# Patient Record
Sex: Female | Born: 2000 | Race: White | Hispanic: No | Marital: Single | Smoking: Never smoker
Health system: Southern US, Community
[De-identification: ages and names within clinical notes are randomized; demographics above are authoritative.]

## PROBLEM LIST (undated history)

## (undated) DIAGNOSIS — M9262 Juvenile osteochondrosis of tarsus, left ankle: Secondary | ICD-10-CM

## (undated) DIAGNOSIS — F909 Attention-deficit hyperactivity disorder, unspecified type: Secondary | ICD-10-CM

## (undated) DIAGNOSIS — M928 Other specified juvenile osteochondrosis: Secondary | ICD-10-CM

## (undated) DIAGNOSIS — N938 Other specified abnormal uterine and vaginal bleeding: Secondary | ICD-10-CM

## (undated) HISTORY — DX: Attention-deficit hyperactivity disorder, unspecified type: F90.9

## (undated) HISTORY — PX: MOUTH SURGERY: SHX715

## (undated) HISTORY — DX: Other specified juvenile osteochondrosis: M92.8

## (undated) HISTORY — DX: Juvenile osteochondrosis of tarsus, left ankle: M92.62

## (undated) HISTORY — DX: Other specified abnormal uterine and vaginal bleeding: N93.8

## (undated) HISTORY — PX: TYMPANOSTOMY TUBE PLACEMENT: SHX32

---
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
## 2020-11-05 DIAGNOSIS — R112 Nausea with vomiting, unspecified: Secondary | ICD-10-CM | POA: Insufficient documentation

## 2020-11-06 ENCOUNTER — Other Ambulatory Visit: Payer: Self-pay

## 2020-11-06 ENCOUNTER — Emergency Department
Admission: EM | Admit: 2020-11-06 | Discharge: 2020-11-06 | Disposition: A | Discharge status: Home / Self Care | Payer: BC Managed Care – PPO | Attending: Emergency Medicine | Admitting: Emergency Medicine

## 2020-11-06 ENCOUNTER — Encounter: Payer: Self-pay | Admitting: Emergency Medicine

## 2020-11-06 DIAGNOSIS — R112 Nausea with vomiting, unspecified: Secondary | ICD-10-CM

## 2020-11-06 LAB — CBC
HCT: 34.1 % — ABNORMAL LOW (ref 36.0–46.0)
Hemoglobin: 10.8 g/dL — ABNORMAL LOW (ref 12.0–15.0)
MCH: 22 pg — ABNORMAL LOW (ref 26.0–34.0)
MCHC: 31.7 g/dL (ref 30.0–36.0)
MCV: 69.5 fL — ABNORMAL LOW (ref 80.0–100.0)
Platelets: 239 10*3/uL (ref 150–400)
RBC: 4.91 MIL/uL (ref 3.87–5.11)
RDW: 17.7 % — ABNORMAL HIGH (ref 11.5–15.5)
WBC: 8.7 10*3/uL (ref 4.0–10.5)
nRBC: 0 % (ref 0.0–0.2)

## 2020-11-06 LAB — URINALYSIS, COMPLETE (UACMP) WITH MICROSCOPIC
Bacteria, UA: NONE SEEN
Bilirubin Urine: NEGATIVE
Glucose, UA: NEGATIVE mg/dL
Hgb urine dipstick: NEGATIVE
Ketones, ur: NEGATIVE mg/dL
Nitrite: NEGATIVE
Protein, ur: NEGATIVE mg/dL
Specific Gravity, Urine: 1.016 (ref 1.005–1.030)
pH: 6 (ref 5.0–8.0)

## 2020-11-06 LAB — LIPASE, BLOOD: Lipase: 35 U/L (ref 11–51)

## 2020-11-06 LAB — COMPREHENSIVE METABOLIC PANEL
ALT: 10 U/L (ref 0–44)
AST: 14 U/L — ABNORMAL LOW (ref 15–41)
Albumin: 4.1 g/dL (ref 3.5–5.0)
Alkaline Phosphatase: 75 U/L (ref 38–126)
Anion gap: 7 (ref 5–15)
BUN: 13 mg/dL (ref 6–20)
CO2: 24 mmol/L (ref 22–32)
Calcium: 9.6 mg/dL (ref 8.9–10.3)
Chloride: 107 mmol/L (ref 98–111)
Creatinine, Ser: 0.7 mg/dL (ref 0.44–1.00)
GFR, Estimated: >60 mL/min (ref >60)
Glucose, Bld: 91 mg/dL (ref 70–99)
Potassium: 3.9 mmol/L (ref 3.5–5.1)
Sodium: 138 mmol/L (ref 135–145)
Total Bilirubin: 0.4 mg/dL (ref 0.3–1.2)
Total Protein: 7.2 g/dL (ref 6.5–8.1)

## 2020-11-06 LAB — POC URINE PREG, ED: Preg Test, Ur: NEGATIVE

## 2020-11-06 MED ORDER — ALUM & MAG HYDROXIDE-SIMETH 200-200-20 MG/5ML PO SUSP
30.0000 mL | Freq: Once | ORAL | Status: AC
  Administered 2020-11-06: 30 mL via ORAL
  Filled 2020-11-06: qty 30

## 2020-11-06 MED ORDER — ONDANSETRON 4 MG PO TBDP
4.0000 mg | ORAL_TABLET | Freq: Once | ORAL | Status: AC | PRN
  Administered 2020-11-06: 4 mg via ORAL
  Filled 2020-11-06: qty 1

## 2020-11-06 MED ORDER — FAMOTIDINE 20 MG PO TABS
20.0000 mg | ORAL_TABLET | Freq: Once | ORAL | Status: AC
  Administered 2020-11-06: 20 mg via ORAL
  Filled 2020-11-06: qty 1

## 2020-11-06 MED ORDER — ONDANSETRON 4 MG PO TBDP: 4.0000 mg | ORAL_TABLET | Freq: Three times a day (TID) | ORAL | 0 refills | Status: DC | PRN

## 2020-11-06 MED ORDER — FAMOTIDINE 20 MG PO TABS: 20.0000 mg | ORAL_TABLET | Freq: Every day | ORAL | 1 refills | Status: DC

## 2020-11-06 NOTE — ED Provider Notes (Signed)
[facility] Emergency Department Provider Note  ____________________________________________  Time seen: Approximately 4:32 AM  I have reviewed the triage vital signs and the nursing notes.   HISTORY  Chief Complaint Vomiting   HPI Sydney Ho is a 20 y.o. female with a history of constipation and GERD who presents for evaluation of nausea and vomiting.  Patient reports having 13 episodes of nonbloody nonbilious emesis that started this afternoon.  No diarrhea, no abdominal pain.  She does feel a sensation of burning in her throat from vomiting so many times.  No hematemesis, no coffee-ground emesis.  No fever or chills.  No known sick contact exposures.  According to the mother patient does have issues with GERD and constipation on a regular basis.  She has never seen a GI doctor.  No fever or respiratory symptoms.   Past Medical History:  Diagnosis Date  . ADHD (attention deficit hyperactivity disorder)   . Apophysitis of left calcaneus   . 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 URI (upper respiratory infection) 09/30/2013  . Sore throat 09/30/2013  . Dysfunctional uterine bleeding 06/22/2013  . Apophysitis of left calcaneus   . Voiding dysfunction 07/05/2012  . Learning difficulty 07/02/2012  . Constipation 07/02/2012  . ADHD (attention deficit hyperactivity disorder) 12/01/2011    Past Surgical History:  Procedure Laterality Date  . MOUTH SURGERY    . TYMPANOSTOMY TUBE PLACEMENT      Prior to Admission medications   Medication Sig Start Date End Date Taking? Authorizing Provider  famotidine (PEPCID) 20 MG tablet Take 1 tablet (20 mg total) by mouth at bedtime. 11/06/20 11/06/21 Yes Darold Miley, [place], MD  ondansetron (ZOFRAN ODT) 4 MG disintegrating tablet Take 1 tablet (4 mg total) by mouth every 8 (eight) hours as needed. 11/06/20  Yes Brain Honeycutt, [place], MD  busPIRone (BUSPAR) 5 MG  tablet Take 5 mg by mouth daily.    [provider]  cetirizine (ZYRTEC) 10 MG tablet Take 1 tablet (10 mg total) by mouth daily. 09/30/13   Georgiann Hahn, MD  fluticasone (FLONASE) 50 MCG/ACT nasal spray Place 1 spray into both nostrils daily. 09/30/13 09/30/14  Georgiann Hahn, MD  ibuprofen (ADVIL,MOTRIN) 200 MG tablet Take 200 mg by mouth every 6 (six) hours as needed for pain.    [provider]  Methylphenidate HCl (QUILLIVANT XR PO) Take by mouth. Take 20ml by mouth daily    [provider]  polyethylene glycol powder (GLYCOLAX/MIRALAX) powder Take 17 g by mouth daily. 07/02/12   Faylene Kurtz, MD    Allergies Lactose intolerance (gi)  Family History  Problem Relation Age of Onset  . Depression Father   . Hyperlipidemia Father   . Endometriosis Mother   . Cancer Paternal Grandfather   . Heart disease Paternal Grandfather     Social History Social History   Tobacco Use  . Smoking status: Never Smoker  . Smokeless tobacco: Never Used  Substance Use Topics  . Alcohol use: No  . Drug use: No    Review of Systems  Constitutional: Negative for fever. Eyes: Negative for visual changes. ENT: Negative for sore throat. Neck: No neck pain  Cardiovascular: Negative for chest pain. Respiratory: Negative for shortness of breath. Gastrointestinal: Negative for abdominal pain, or diarrhea. + vomiting Genitourinary: Negative for dysuria. Musculoskeletal: Negative for back pain. Skin: Negative for rash. Neurological: Negative for headaches, weakness or numbness. Psych: No SI or HI  ____________________________________________   PHYSICAL EXAM:  VITAL SIGNS: Vitals:   11/06/20 0315 11/06/20 0435  BP: 101/71 102/78  Pulse: 80 88  Resp: 16 18  Temp:    SpO2: 100% 100%    Constitutional: Alert and oriented. Well appearing and in no apparent distress. HEENT:      Head: Normocephalic and atraumatic.         Eyes: Conjunctivae are normal. Sclera  is non-icteric.       Mouth/Throat: Mucous membranes are moist.       Neck: Supple with no signs of meningismus. Cardiovascular: Regular rate and rhythm. No murmurs, gallops, or rubs. 2+ symmetrical distal pulses are present in all extremities. No JVD. Respiratory: Normal respiratory effort. Lungs are clear to auscultation bilaterally.  Gastrointestinal: Soft, non tender, and non distended with positive bowel sounds. No rebound or guarding. Genitourinary: No CVA tenderness. Musculoskeletal:  No edema, cyanosis, or erythema of extremities. Neurologic: Normal speech and language. Face is symmetric. Moving all extremities. No gross focal neurologic deficits are appreciated. Skin: Skin is warm, dry and intact. No rash noted. Psychiatric: Mood and affect are normal. Speech and behavior are normal.  ____________________________________________   LABS (all labs ordered are listed, but only abnormal results are displayed)  Labs Reviewed  COMPREHENSIVE METABOLIC PANEL - Abnormal; Notable for the following components:      Result Value   AST 14 (*)    All other components within normal limits  CBC - Abnormal; Notable for the following components:   Hemoglobin 10.8 (*)    HCT 34.1 (*)    MCV 69.5 (*)    MCH 22.0 (*)    RDW 17.7 (*)    All other components within normal limits  URINALYSIS, COMPLETE (UACMP) WITH MICROSCOPIC - Abnormal; Notable for the following components:   Color, Urine YELLOW (*)    APPearance HAZY (*)    Leukocytes,Ua TRACE (*)    All other components within normal limits  LIPASE, BLOOD  POC URINE PREG, ED   ____________________________________________  EKG  none  ____________________________________________  RADIOLOGY  none  ____________________________________________   PROCEDURES  Procedure(s) performed: None Procedures Critical Care performed:  None ____________________________________________   INITIAL IMPRESSION / ASSESSMENT AND PLAN / ED  COURSE  20 y.o. female with a history of constipation and GERD who presents for evaluation of nausea and vomiting.  Patient is extremely well-appearing in no distress with normal vital signs, abdomen is soft and nontender throughout with positive bowel sounds, no distention.  No signs of dehydration.  Negative pregnancy test.  No signs of pancreatitis with normal lipase.  No signs of gallbladder disorder with no abdominal tenderness, normal LFTs and T bili.  No leukocytosis.  No signs of diabetes with normal sugar UA negative for urinary tract infection or any signs of dehydration.  Patient does have mild anemia with a low MCV consistent with iron deficiency anemia.  She has a history of such according to her mother who was at bedside.  She does not take any supplements for it.  Her anemia is from abnormal uterine bleeding.  Her presentation is concerning for GERD versus ingestion versus gastritis versus food poisoning versus viral illness.  Will give p.o. Zofran, Pepcid, Maalox and p.o. challenge.  History gathered from patient and her mother who was at bedside.  Old medical records reviewed.  Recommended follow-up with GI since patient seems to have these problems constantly.  Plan to discharge home on Pepcid.  _________________________ 5:38 AM on 11/06/2020 -----------------------------------------  Patient tolerating p.o. no further episodes of vomiting.  Stable for discharge return precautions discussed with her and her mother    _____________________________________________ Please note:  Patient was evaluated in Emergency Department today for the symptoms described in the history of present illness. Patient was evaluated in the context of the global COVID-19 pandemic, which necessitated consideration that the patient might be at risk for infection with the SARS-CoV-2 virus that causes COVID-19. Institutional protocols and algorithms that pertain to the evaluation of patients at risk for COVID-19  are in a state of rapid change based on information released by regulatory bodies including the CDC and federal and state organizations. These policies and algorithms were followed during the patient's care in the ED.  Some ED evaluations and interventions may be delayed as a result of limited staffing during the pandemic.   [place] Controlled Substance Database was reviewed by me. ____________________________________________   FINAL CLINICAL IMPRESSION(S) / ED DIAGNOSES   Final diagnoses:  Non-intractable vomiting with nausea, unspecified vomiting type      NEW MEDICATIONS STARTED DURING THIS VISIT:  ED Discharge Orders         Ordered    ondansetron (ZOFRAN ODT) 4 MG disintegrating tablet  Every 8 hours PRN        11/06/20 0435    famotidine (PEPCID) 20 MG tablet  Daily at bedtime        11/06/20 0435           Note:  This document was prepared using Dragon voice recognition software and may include unintentional dictation errors.    Don Perking, [place], MD 11/06/20 [phone]

## 2020-11-06 NOTE — Discharge Instructions (Addendum)
Take Pepcid every night as prescribed to avoid having similar symptoms.  Take Zofran as needed for nausea.  Increase oral hydration and bland diet for the next 48 hours.  You will need to see a GI doctor within the next few months.  Make sure to call the office to schedule an appointment.  Return to the emergency room for have a fever, abdominal pain, chest pain or shortness of breath.

## 2020-11-06 NOTE — ED Notes (Signed)
Pt given graham crackers and ginger ale for PO challenge at this time.

## 2020-11-06 NOTE — ED Triage Notes (Signed)
Pt to ED from home c/o vomiting today.  13 episodes of vomiting after eating several different things at home.  Denies pain, nausea now but no vomiting since being here.  A&Ox4, chest rise even and unlabored, skin WNL, in NAD at this time.

## 2021-10-12 ENCOUNTER — Other Ambulatory Visit: Payer: Self-pay | Admitting: Gastroenterology

## 2021-10-12 DIAGNOSIS — R1011 Right upper quadrant pain: Secondary | ICD-10-CM

## 2021-10-14 ENCOUNTER — Ambulatory Visit
Admission: RE | Admit: 2021-10-14 | Discharge: 2021-10-14 | Disposition: A | Discharge status: Home / Self Care | Payer: BLUE CROSS/BLUE SHIELD | Source: Ambulatory Visit | Attending: Gastroenterology | Admitting: Gastroenterology

## 2021-10-14 ENCOUNTER — Other Ambulatory Visit: Payer: Self-pay

## 2021-10-14 DIAGNOSIS — R1011 Right upper quadrant pain: Secondary | ICD-10-CM

## 2022-09-23 ENCOUNTER — Other Ambulatory Visit: Payer: Self-pay

## 2022-09-23 ENCOUNTER — Emergency Department
Admission: EM | Admit: 2022-09-23 | Discharge: 2022-09-23 | Disposition: A | Discharge status: Home / Self Care | Payer: BC Managed Care – PPO | Attending: Emergency Medicine | Admitting: Emergency Medicine

## 2022-09-23 DIAGNOSIS — S0181XA Laceration without foreign body of other part of head, initial encounter: Secondary | ICD-10-CM | POA: Diagnosis not present

## 2022-09-23 DIAGNOSIS — S0990XA Unspecified injury of head, initial encounter: Secondary | ICD-10-CM | POA: Diagnosis present

## 2022-09-23 DIAGNOSIS — W010XXA Fall on same level from slipping, tripping and stumbling without subsequent striking against object, initial encounter: Secondary | ICD-10-CM | POA: Insufficient documentation

## 2022-09-23 MED ORDER — LIDOCAINE-EPINEPHRINE-TETRACAINE (LET) TOPICAL GEL
3.0000 mL | Freq: Once | TOPICAL | Status: AC
  Administered 2022-09-23: 3 mL via TOPICAL
  Filled 2022-09-23: qty 3

## 2022-09-23 MED ORDER — CEPHALEXIN 500 MG PO CAPS: 500.0000 mg | ORAL_CAPSULE | Freq: Four times a day (QID) | ORAL | 0 refills | Status: AC

## 2022-09-23 NOTE — ED Provider Notes (Signed)
[facility] Provider Note  Patient Contact: 5:55 PM (approximate)   History   Laceration (Chin)   HPI  Sydney Ho is a 22 y.o. female presents to the emergency department with a 3 cm laceration under chin.  Patient was wearing cowboy boots that were too large for her and she reached down to grab keychain and lost her balance.  No loss of consciousness occurred.  No jaw pain or numbness or tingling in the upper and lower extremities.  No chest pain, chest tightness or abdominal pain.      Physical Exam   Triage Vital Signs: ED Triage Vitals  Enc Vitals Group     BP 09/23/22 1610 (!) 120/91     Pulse Rate 09/23/22 1610 100     Resp 09/23/22 1610 16     Temp 09/23/22 1610 (!) 97.5 F (36.4 C)     Temp Source 09/23/22 1610 Oral     SpO2 09/23/22 1610 98 %     Weight 09/23/22 1611 135 lb (61.2 kg)     Height 09/23/22 1611 '5\' 6"'$  (1.676 m)     Head Circumference --      Peak Flow --      Pain Score 09/23/22 1611 10     Pain Loc --      Pain Edu? --      Excl. in [place]? --     Most recent vital signs: Vitals:   09/23/22 1610  BP: (!) 120/91  Pulse: 100  Resp: 16  Temp: (!) 97.5 F (36.4 C)  SpO2: 98%     General: Alert and in no acute distress. Eyes:  PERRL. EOMI. Head: No acute traumatic findings ENT:      Nose: No congestion/rhinnorhea.      Mouth/Throat: Mucous membranes are moist. Neck: No stridor. No cervical spine tenderness to palpation. Cardiovascular:  Good peripheral perfusion Respiratory: Normal respiratory effort without tachypnea or retractions. Lungs CTAB. Good air entry to the bases with no decreased or absent breath sounds. Gastrointestinal: Bowel sounds 4 quadrants. Soft and nontender to palpation. No guarding or rigidity. No palpable masses. No distention. No CVA tenderness. Musculoskeletal: Full range of motion to all extremities.  Neurologic:  No gross focal neurologic deficits are appreciated.  Skin: Patient has  a 3 cm laceration chin.    ED Results / Procedures / Treatments   Labs (all labs ordered are listed, but only abnormal results are displayed) Labs Reviewed - No data to display       PROCEDURES:  Critical Care performed: No  ..Laceration Repair  Date/Time: 09/23/2022 5:57 PM  Performed by: Lannie Fields, PA-C Authorized by: Lannie Fields, PA-C   Consent:    Consent obtained:  Verbal   Risks discussed:  Infection and pain   Alternatives discussed:  No treatment Universal protocol:    Procedure explained and questions answered to patient or proxy's satisfaction: yes     Patient identity confirmed:  Verbally with patient Anesthesia:    Anesthesia method:  Topical application   Topical anesthetic:  LET Laceration details:    Location:  Face   Face location:  Chin   Length (cm):  3   Depth (mm):  5 Pre-procedure details:    Preparation:  Patient was prepped and draped in usual sterile fashion Exploration:    Limited defect created (wound extended): no     Contaminated: no   Treatment:    Area cleansed with:  Povidone-iodine  Amount of cleaning:  Standard   Debridement:  None Skin repair:    Repair method:  Sutures   Suture size:  5-0   Suture technique:  Simple interrupted   Number of sutures:  4 Approximation:    Approximation:  Close Repair type:    Repair type:  Simple Post-procedure details:    Dressing:  Open (no dressing)    MEDICATIONS ORDERED IN ED: Medications  lidocaine-EPINEPHrine-tetracaine (LET) topical gel (has no administration in time range)     IMPRESSION / MDM / ASSESSMENT AND PLAN / ED COURSE  I reviewed the triage vital signs and the nursing notes.                              Assessment and plan: Facial laceration:  22 year old female presents to the emergency department with a chin laceration after mechanical fall.  Vital signs are reassuring at triage.  On exam, patient alert, active and nontoxic-appearing with no  neurodeficits.  Patient underwent laceration repair without complication.  Patient education regarding wound care was given.  Return precautions were given to return with new or worsening symptoms.  Tetanus status is up-to-date.  All patient questions were answered.     FINAL CLINICAL IMPRESSION(S) / ED DIAGNOSES   Final diagnoses:  Chin laceration, initial encounter     Rx / DC Orders   ED Discharge Orders          Ordered    cephALEXin (KEFLEX) 500 MG capsule  4 times daily        09/23/22 1856             Note:  This document was prepared using Dragon voice recognition software and may include unintentional dictation errors.   Vallarie Mare [place], PA-C 09/23/22 1857    Nathaniel Man, MD 09/23/22 2012

## 2022-09-23 NOTE — ED Triage Notes (Signed)
Pt to ED from home for laceration. Pt tripped and fell and landed on her chin. Pt denies any pain any other places. Pt is CAOx4 and in no acute distress in triage. Ambulatory in triage. Pt has small laceration to chin with bleeding controlled.

## 2022-09-23 NOTE — Discharge Instructions (Addendum)
Take Keflex four times daily for the next seven days.  Apply thin layer of Vaseline once daily. Have sutures removed in 7 days.

## 2022-11-21 ENCOUNTER — Ambulatory Visit: Payer: BC Managed Care – PPO | Attending: Cardiovascular Disease | Admitting: Cardiovascular Disease

## 2022-11-21 ENCOUNTER — Encounter: Payer: Self-pay | Admitting: Cardiovascular Disease

## 2022-11-21 ENCOUNTER — Ambulatory Visit (INDEPENDENT_AMBULATORY_CARE_PROVIDER_SITE_OTHER): Payer: BC Managed Care – PPO

## 2022-11-21 VITALS — BP 100/72 | HR 82 | Ht 66.0 in | Wt 163.1 lb

## 2022-11-21 DIAGNOSIS — R0602 Shortness of breath: Secondary | ICD-10-CM

## 2022-11-21 DIAGNOSIS — R002 Palpitations: Secondary | ICD-10-CM

## 2022-11-21 DIAGNOSIS — R079 Chest pain, unspecified: Secondary | ICD-10-CM

## 2022-11-21 NOTE — Patient Instructions (Signed)
Medication Instructions:  No changes *If you need a refill on your cardiac medications before your next appointment, please call your pharmacy*   Lab Work: None ordered If you have labs (blood work) drawn today and your tests are completely normal, you will receive your results only by: MyChart Message (if you have MyChart) OR A paper copy in the mail If you have any lab test that is abnormal or we need to change your treatment, we will call you to review the results.   Testing/Procedures: Your physician has requested that you have an echocardiogram. Echocardiography is a painless test that uses sound waves to create images of your heart. It provides your doctor with information about the size and shape of your heart and how well your heart's chambers and valves are working.   You may receive an ultrasound enhancing agent through an IV if needed to better visualize your heart during the echo. This procedure takes approximately one hour.  There are no restrictions for this procedure.  This will take place at [address] [address], [place] [address]    Follow-Up: At [facility], you and your health needs are our priority.  As part of our continuing mission to provide you with exceptional heart care, we have created designated Provider Care Teams.  These Care Teams include your primary Cardiologist (physician) and Advanced Practice Providers (APPs -  Physician Assistants and Nurse Practitioners) who all work together to provide you with the care you need, when you need it.  We recommend signing up for the patient portal called "MyChart".  Sign up information is provided on this After Visit Summary.  MyChart is used to connect with patients for Virtual Visits (Telemedicine).  Patients are able to view lab/test results, encounter notes, upcoming appointments, etc.  Non-urgent messages can be sent to your provider as well.   To learn more about what you can  do with MyChart, go to [email or web address].    Your next appointment:   Follow up as needed with Dr. Kirke Corin Other Instructions ZIO XT- Long Term Monitor Instructions  Your physician has requested you wear a ZIO patch monitor for 14 days.  This is a single patch monitor. [employer] supplies one patch monitor per enrollment. Additional stickers are not available. Please do not apply patch if you will be having a Nuclear Stress Test,  Echocardiogram, Cardiac CT, MRI, or Chest Xray during the period you would be wearing the  monitor. The patch cannot be worn during these tests. You cannot remove and re-apply the  ZIO XT patch monitor.  Your ZIO patch monitor will be mailed 3 day USPS to your address on file. It may take 3-5 days  to receive your monitor after you have been enrolled.  Once you have received your monitor, please review the enclosed instructions. Your monitor  has already been registered assigning a specific monitor serial # to you.  Billing and Patient Assistance Program Information  We have supplied [employer] with any of your insurance information on file for billing purposes. [employer] offers a sliding scale Patient Assistance Program for patients that do not have  insurance, or whose insurance does not completely cover the cost of the ZIO monitor.  You must apply for the Patient Assistance Program to qualify for this discounted rate.  To apply, please call [employer] at [phone], select option 4, select option 2, ask to apply for  Patient Assistance Program. Meredeth Ide will ask your household income, and how many  people  are in your household. They will quote your out-of-pocket cost based on that information.  [employer] will also be able to set up a 32-month, interest-free payment plan if needed.  Applying the monitor   Shave hair from upper left chest.  Hold abrader disc by orange tab. Rub abrader in 40 strokes over the upper left chest as  indicated in your monitor  instructions.  Clean area with 4 enclosed alcohol pads. Let dry.  Apply patch as indicated in monitor instructions. Patch will be placed under collarbone on left  side of chest with arrow pointing upward.  Rub patch adhesive wings for 2 minutes. Remove white label marked "1". Remove the white  label marked "2". Rub patch adhesive wings for 2 additional minutes.  While looking in a mirror, press and release button in center of patch. A small green light will  flash 3-4 times. This will be your only indicator that the monitor has been turned on.  Do not shower for the first 24 hours. You may shower after the first 24 hours.  Press the button if you feel a symptom. You will hear a small click. Record Date, Time and  Symptom in the Patient Logbook.  When you are ready to remove the patch, follow instructions on the last 2 pages of Patient  Logbook. Stick patch monitor onto the last page of Patient Logbook.  Place Patient Logbook in the blue and white box. Use locking tab on box and tape box closed  securely. The blue and white box has prepaid postage on it. Please place it in the mailbox as  soon as possible. Your physician should have your test results approximately 7 days after the  monitor has been mailed back to [facility].  Call [facility] Customer Care at [phone] if you have questions regarding  your ZIO XT patch monitor. Call them immediately if you see an orange light blinking on your  monitor.  If your monitor falls off in less than 4 days, contact our Monitor department at [phone].  If your monitor becomes loose or falls off after 4 days call [employer] at [phone] for  suggestions on securing your monitor

## 2022-11-21 NOTE — Progress Notes (Signed)
Cardiology Office Note   Date:  11/21/2022   ID:  [name], DOB [birth year], MRN [id number]  PCP:  Lonie Peak, PA-C  Cardiologist:   Lorine Bears, MD   Chief Complaint  Patient presents with   New Patient (Initial Visit)    Palpitations. Meds reviewed verbally with pt.      History of Present Illness: Sydney Ho is a 22 y.o. female who was referred by Lonie Peak for evaluation of palpitations.  Her mother is with her at the visit.  She reports history of palpitations and tachycardia as a child.  She reports having an echocardiogram at the age of 62 or 22 years old which was unremarkable. Over the last year, she experienced intermittent episodes of tachycardia with associated chest pain and shortness of breath.  She feels like she is having a panic attack but denies any stress or anxiety.  She gets very dizzy with these episodes but no syncope.  She is a lifelong non-smoker.  She does drink excessive amount of sweet tea and occasional coffee and is trying to quit on caffeine intake. She has known history of ADHD but is no longer on medications which made her feel bad.    Past Medical History:  Diagnosis Date   ADHD (attention deficit hyperactivity disorder)    Apophysitis of left calcaneus    Dysfunctional uterine bleeding 11.2014    Past Surgical History:  Procedure Laterality Date   MOUTH SURGERY     TYMPANOSTOMY TUBE PLACEMENT       Current Outpatient Medications  Medication Sig Dispense Refill   COLLAGEN PO Take by mouth daily.     ibuprofen (ADVIL,MOTRIN) 200 MG tablet Take 200 mg by mouth every 6 (six) hours as needed for pain.     No current facility-administered medications for this visit.    Allergies:   Buspar [buspirone] and Lactose intolerance (gi)    Social History:  The patient  reports that she has never smoked. She has never used smokeless tobacco. She reports that she does not drink alcohol and does not use drugs.   Family  History:  The patient's family history includes Atrial fibrillation in her father, paternal grandmother, and paternal uncle; Cancer in her paternal grandfather; Depression in her father; Endometriosis in her mother; Heart disease in her paternal grandfather; Hyperlipidemia in her father and mother.    ROS:  Please see the history of present illness.   Otherwise, review of systems are positive for none.   All other systems are reviewed and negative.    PHYSICAL EXAM: VS:  BP 100/72 (BP Location: Right Arm, Patient Position: Sitting, Cuff Size: Normal)   Pulse 82   Ht  (1.676 m)   Wt 163 lb 2 oz (74 kg)   SpO2 99%   BMI 26.33 kg/m  , BMI Body mass index is 26.33 kg/m. GEN: Well nourished, well developed, in no acute distress  HEENT: normal  Neck: no JVD, carotid bruits, or masses Cardiac: RRR; no murmurs, rubs, or gallops,no edema  Respiratory:  clear to auscultation bilaterally, normal work of breathing GI: soft, nontender, nondistended, + BS MS: no deformity or atrophy  Skin: warm and dry, no rash Neuro:  Strength and sensation are intact Psych: euthymic mood, full affect   EKG:  EKG is ordered today. The ekg ordered today demonstrates normal sinus rhythm with sinus arrhythmia.   Recent Labs: No results found for requested labs within last 365 days.  Lipid Panel No results found for: "CHOL", "TRIG", "HDL", "CHOLHDL", "VLDL", "LDLCALC", "LDLDIRECT"    Wt Readings from Last 3 Encounters:  11/21/22 163 lb 2 oz (74 kg)  09/23/22 135 lb (61.2 kg)  11/06/20 104 lb (47.2 kg) (7 %, Z= -1.48)*   * Growth percentiles are based on CDC (Girls, 2-20 Years) data.          11/21/2022    2:05 PM  PAD Screen  Previous PAD dx? No  Previous surgical procedure? No  Pain with walking? No  Feet/toe relief with dangling? No  Painful, non-healing ulcers? No  Extremities discolored? No      ASSESSMENT AND PLAN:  1.  Palpitations and tachycardia: We have to exclude SVT.   Baseline EKG is unremarkable.  I requested a 2-week ZIO monitor.  I did discuss with her the utility of smart phone or smart watch monitors if she continues to have these episodes with nondiagnostic ZIO monitor.  She reports having labs done recently with her primary care physician.  I recommend checking her thyroid function if that has not been already done. I discussed with her the importance of cutting down on caffeine intake and regular exercise.  2.  Chest pain and shortness of breath, in the setting of tachycardia.  I requested an echocardiogram to ensure no structural heart abnormalities.    Disposition:   FU with me as needed.  Signed,  Lorine Bears, MD  11/21/2022 2:22 PM    [facility]

## 2022-11-27 DIAGNOSIS — R002 Palpitations: Secondary | ICD-10-CM | POA: Diagnosis not present

## 2022-12-20 ENCOUNTER — Other Ambulatory Visit: Payer: BC Managed Care – PPO

## 2022-12-27 ENCOUNTER — Other Ambulatory Visit: Payer: BC Managed Care – PPO

## 2022-12-27 ENCOUNTER — Telehealth: Payer: Self-pay | Admitting: Cardiovascular Disease

## 2022-12-27 NOTE — Telephone Encounter (Signed)
  Pt is calling to get heart monitor result  

## 2022-12-27 NOTE — Telephone Encounter (Signed)
The patient has been notified of the result along with recommendations.Pt verbalized understanding. All questions (if any) were answered        Sydney Ouch, MD 12/22/2022  6:07 PM EDT     Inform patient that monitor was normal with no evidence of arrhythmia.

## 2023-01-09 ENCOUNTER — Ambulatory Visit: Payer: BC Managed Care – PPO | Attending: Cardiovascular Disease

## 2023-01-09 DIAGNOSIS — R0602 Shortness of breath: Secondary | ICD-10-CM | POA: Diagnosis not present

## 2023-01-09 DIAGNOSIS — R079 Chest pain, unspecified: Secondary | ICD-10-CM

## 2023-01-09 LAB — ECHOCARDIOGRAM COMPLETE
Area-P 1/2: 3.85 cm2
S' Lateral: 3.1 cm

## 2023-01-28 ENCOUNTER — Emergency Department: Payer: BC Managed Care – PPO

## 2023-01-28 ENCOUNTER — Emergency Department
Admission: EM | Admit: 2023-01-28 | Discharge: 2023-01-28 | Disposition: A | Discharge status: Home / Self Care | Payer: BC Managed Care – PPO | Attending: Emergency Medicine | Admitting: Emergency Medicine

## 2023-01-28 ENCOUNTER — Other Ambulatory Visit: Payer: Self-pay

## 2023-01-28 DIAGNOSIS — R079 Chest pain, unspecified: Secondary | ICD-10-CM | POA: Diagnosis present

## 2023-01-28 LAB — CBC
HCT: 38.2 % (ref 36.0–46.0)
Hemoglobin: 11.7 g/dL — ABNORMAL LOW (ref 12.0–15.0)
MCH: 22.9 pg — ABNORMAL LOW (ref 26.0–34.0)
MCHC: 30.6 g/dL (ref 30.0–36.0)
MCV: 74.9 fL — ABNORMAL LOW (ref 80.0–100.0)
Platelets: 232 10*3/uL (ref 150–400)
RBC: 5.1 MIL/uL (ref 3.87–5.11)
RDW: 14.6 % (ref 11.5–15.5)
WBC: 6.3 10*3/uL (ref 4.0–10.5)
nRBC: 0 % (ref 0.0–0.2)

## 2023-01-28 LAB — BASIC METABOLIC PANEL
Anion gap: 7 (ref 5–15)
BUN: 11 mg/dL (ref 6–20)
CO2: 24 mmol/L (ref 22–32)
Calcium: 9.2 mg/dL (ref 8.9–10.3)
Chloride: 106 mmol/L (ref 98–111)
Creatinine, Ser: 0.85 mg/dL (ref 0.44–1.00)
GFR, Estimated: >60 mL/min (ref >60)
Glucose, Bld: 89 mg/dL (ref 70–99)
Potassium: 3.9 mmol/L (ref 3.5–5.1)
Sodium: 137 mmol/L (ref 135–145)

## 2023-01-28 LAB — TROPONIN I (HIGH SENSITIVITY): Troponin I (High Sensitivity): <2 ng/L (ref <18)

## 2023-01-28 LAB — MAGNESIUM: Magnesium: 2.1 mg/dL (ref 1.7–2.4)

## 2023-01-28 LAB — TSH: TSH: 2.442 u[IU]/mL (ref 0.350–4.500)

## 2023-01-28 NOTE — Discharge Instructions (Signed)
You were seen in the Emergency Department today for evaluation of your chest pain. Fortunately, your labs, EKG, and chest x-Sydney Ho were overall reassuring against a emergency cause for your pain. Please follow-up with your primary doctor within the next few days for reevaluation.Return to the ER for any new or worsening symptoms including worsening chest pain, difficulty breathing, or any other new or concerning symptoms that you believe warrants immediate attention.   

## 2023-01-28 NOTE — ED Triage Notes (Signed)
Pt states coming in with chest pressure and stuttering, which has since resolved. Pt states this has been a recurring thing for over a year and that her providers have told her she has anxiety, but pt denies feeling anxious. Pt denies blurred vision or dizziness as well.

## 2023-01-28 NOTE — ED Notes (Signed)
Pt A&O x4, no obvious distress noted, respirations regular/unlabored. Pt verbalizes understanding of discharge instructions. Pt able to ambulate from ED independently.   

## 2023-01-28 NOTE — ED Provider Notes (Signed)
[facility] Provider Note    Event Date/Time   First MD Initiated Contact with Patient 01/28/23 1232     (approximate)   History   Chest Pain   HPI  Sydney Ho is a 22 y.o. female presenting to the emergency department for evaluation of chest pain and palpitations.  For the last year, patient has been having ongoing issues related to this.  Today, she had a 10-minute episode of chest pressure with associated palpitations and some stuttering speech.  She has seen a primary care doctor and cardiologist for her symptoms.  She had a normal heart monitor, though she does not report any episodes while wearing this, and a reassuring echocardiogram.  She called her primary care doctor today who recommended ER presentation for further evaluation.  Patient currently reports her chest pain has resolved.  I did review her outpatient cardiology note during which she was ordered for heart monitor and echocardiogram.  At that time, thyroid studies were recommended, do not see result for these in her chart.     Physical Exam   Triage Vital Signs: ED Triage Vitals  Enc Vitals Group     BP 01/28/23 1215 106/75     Pulse Rate 01/28/23 1215 90     Resp 01/28/23 1215 15     Temp 01/28/23 1215 97.8 F (36.6 C)     Temp Source 01/28/23 1215 Oral     SpO2 01/28/23 1215 98 %     Weight 01/28/23 1216 163 lb 2.3 oz (74 kg)     Height 01/28/23 1216 5\' 6"  (1.676 m)     Head Circumference --      Peak Flow --      Pain Score 01/28/23 1216 0     Pain Loc --      Pain Edu? --      Excl. in GC? --     Most recent vital signs: Vitals:   01/28/23 1215  BP: 106/75  Pulse: 90  Resp: 15  Temp: 97.8 F (36.6 C)  SpO2: 98%     General: Awake, interactive  CV:  Regular rate, good peripheral perfusion.  Resp:  Lungs clear, unlabored respirations.  Abd:  Soft, nondistended.  Neuro:  Symmetric facial movement, fluid speech   ED Results / Procedures / Treatments    Labs (all labs ordered are listed, but only abnormal results are displayed) Labs Reviewed  CBC - Abnormal; Notable for the following components:      Result Value   Hemoglobin 11.7 (*)    MCV 74.9 (*)    MCH 22.9 (*)    All other components within normal limits  BASIC METABOLIC PANEL  TSH  MAGNESIUM  TROPONIN I (HIGH SENSITIVITY)     EKG EKG independently reviewed interpreted by myself (ER attending) demonstrates:  EKG demonstrates normal sinus rhythm -83, PR 122, QRS 78, QTc 439, no acute ST changes  RADIOLOGY Imaging independently reviewed and interpreted by myself demonstrates:  CXR without pneumonia  PROCEDURES:  Critical Care performed: No  Procedures   MEDICATIONS ORDERED IN ED: Medications - No data to display   IMPRESSION / MDM / ASSESSMENT AND PLAN / ED COURSE  I reviewed the triage vital signs and the nursing notes.  Differential diagnosis includes, but is not limited to, anemia, electrolyte abnormality, thyroid dysfunction, low suspicion ACS, consideration for pneumonia, pneumothorax  Patient's presentation is most consistent with acute presentation with potential threat to life or bodily function.  22 year old female presenting with chest pain and palpitations.  Ongoing symptoms for almost a year with multiple outpatient evaluations.  Will obtain screening lab work including thyroid function.  Currently asymptomatic.  Lab work overall reassuring.  Mild anemia, actually improved from prior.  Electrolytes within normal limits.  TSH within normal limits.  Patient reevaluated.  No new symptoms.  Discussed reassuring workup and recommended continued outpatient follow-up.  Patient expressed understanding.  She is comfortable discharge home.  Strict return precautions provided.     FINAL CLINICAL IMPRESSION(S) / ED DIAGNOSES   Final diagnoses:  Nonspecific chest pain     Rx / DC Orders   ED Discharge Orders     None        Note:  This document  was prepared using Dragon voice recognition software and may include unintentional dictation errors.   Trinna Post, MD 01/28/23 [phone]

## 2023-09-30 IMAGING — US US ABDOMEN LIMITED
1 series · 14 of 25 positions shown · non-contrast
Comparison: None.

CLINICAL DATA: Right upper quadrant abdominal pain.

EXAM:
ULTRASOUND ABDOMEN LIMITED RIGHT UPPER QUADRANT

[Series 1: us abdomen limited · 0.17mm/px · 14 of 48 slices shown]
[im 1/48]
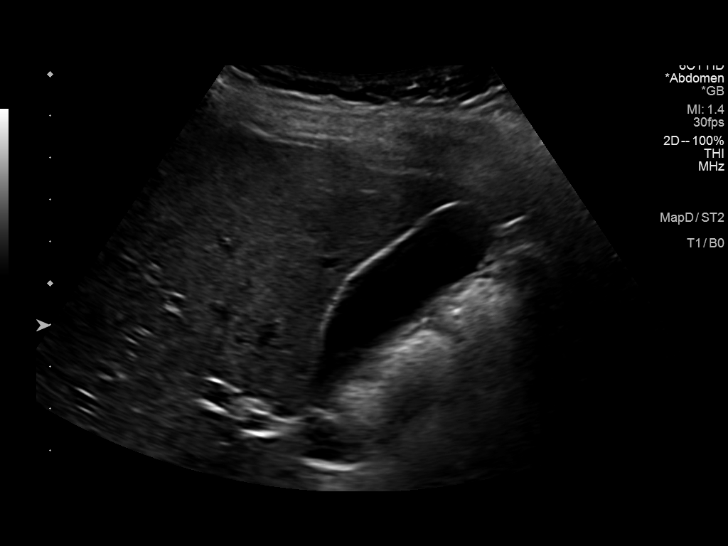
[im 4/48]
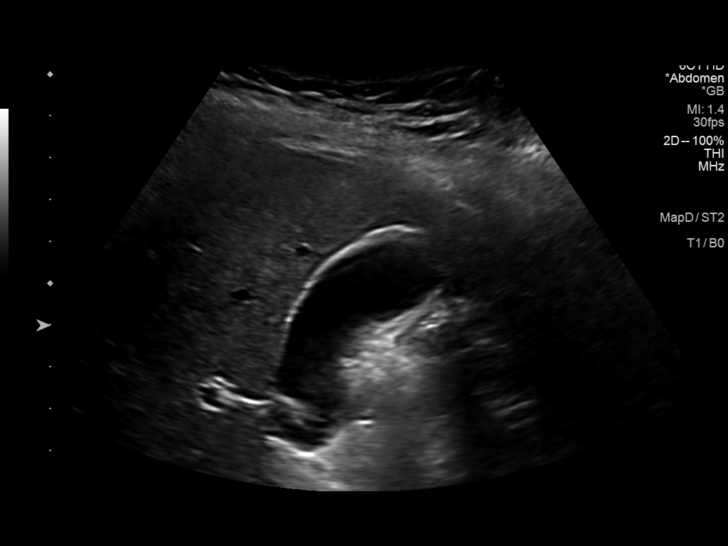
[im 8/48]
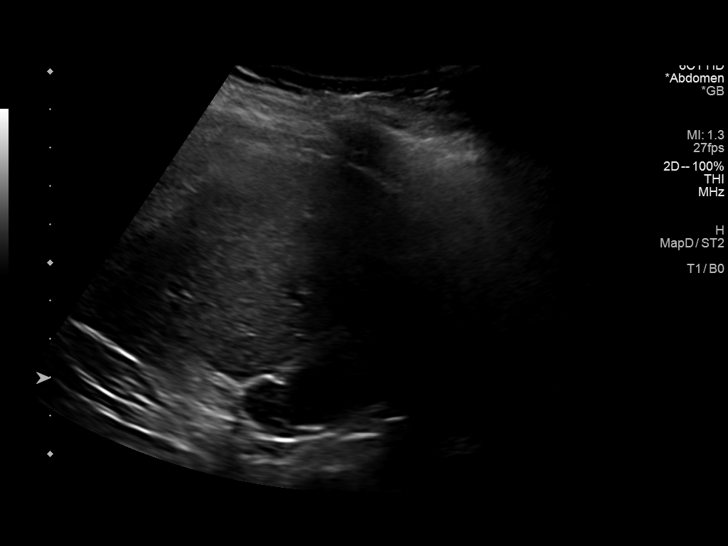
[im 12/48]
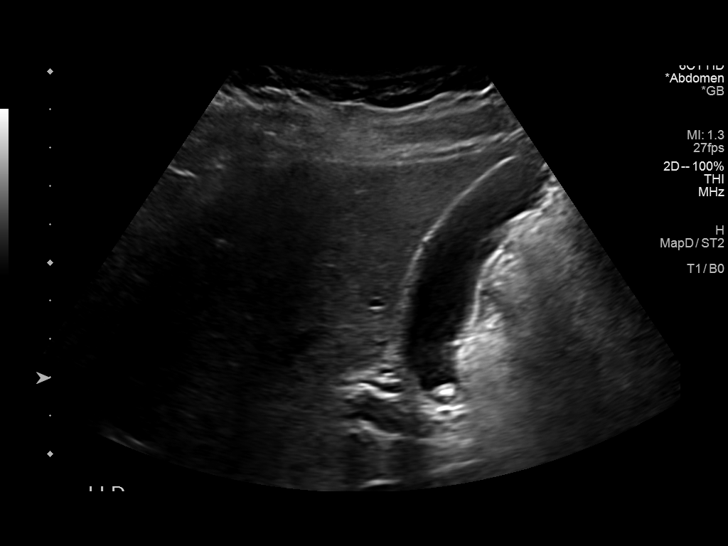
[im 16/48]
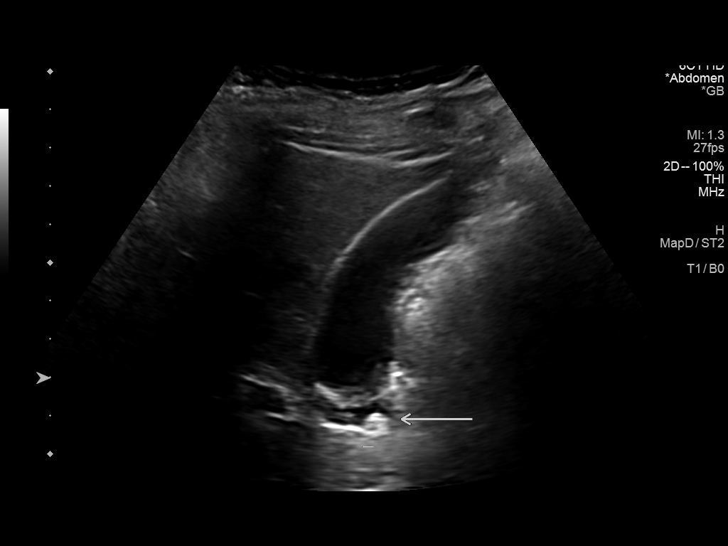
[im 18/48]
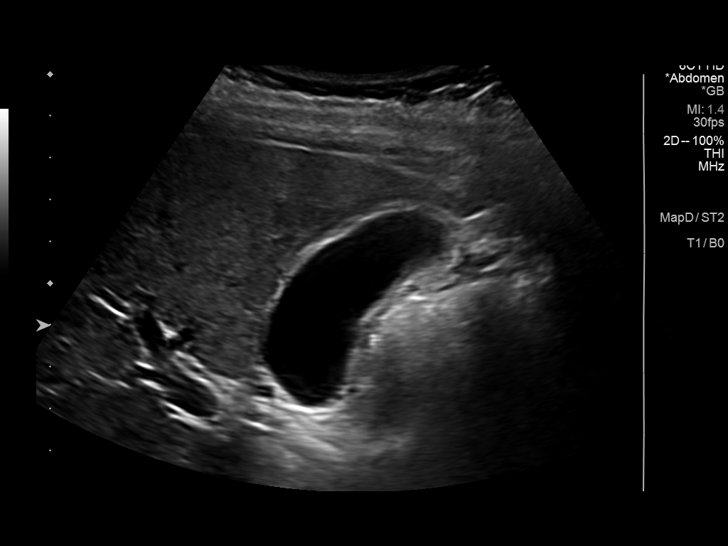
[im 22/48]
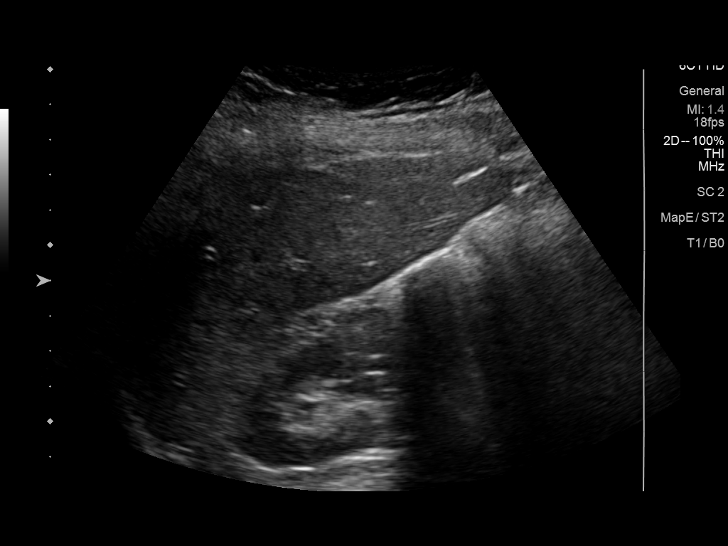
[im 26/48]
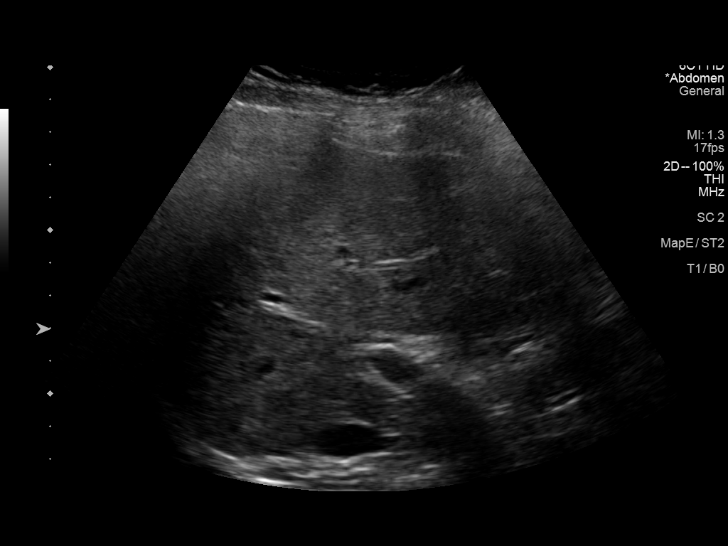
[im 30/48]
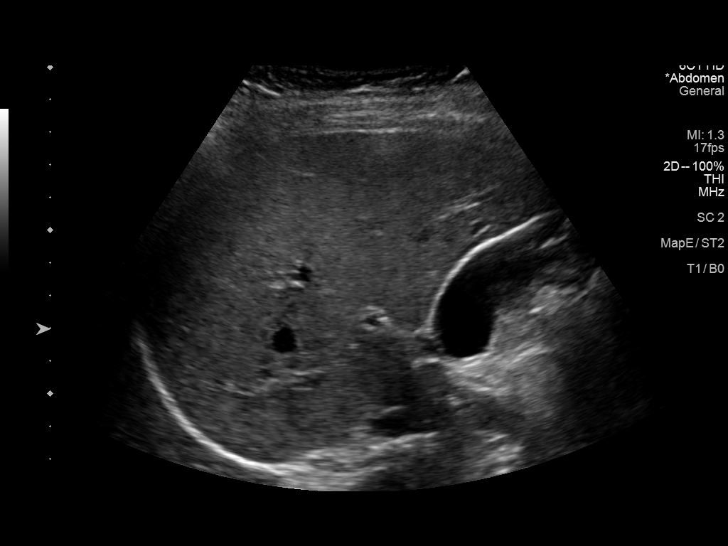
[im 32/48]
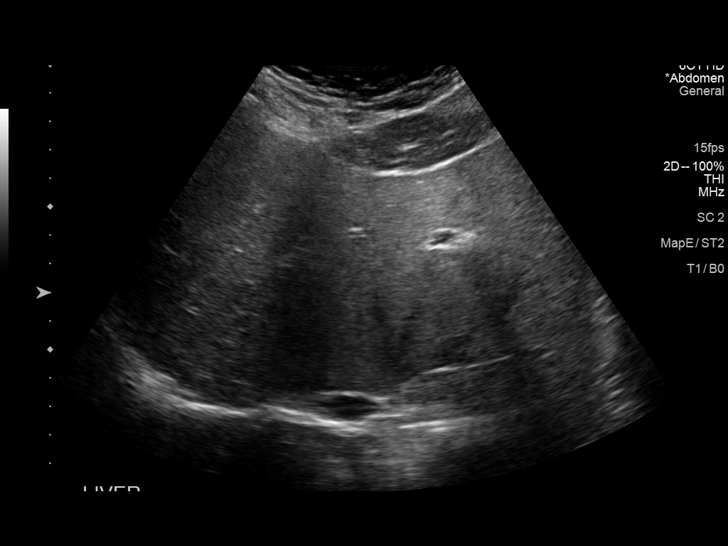
[im 36/48]
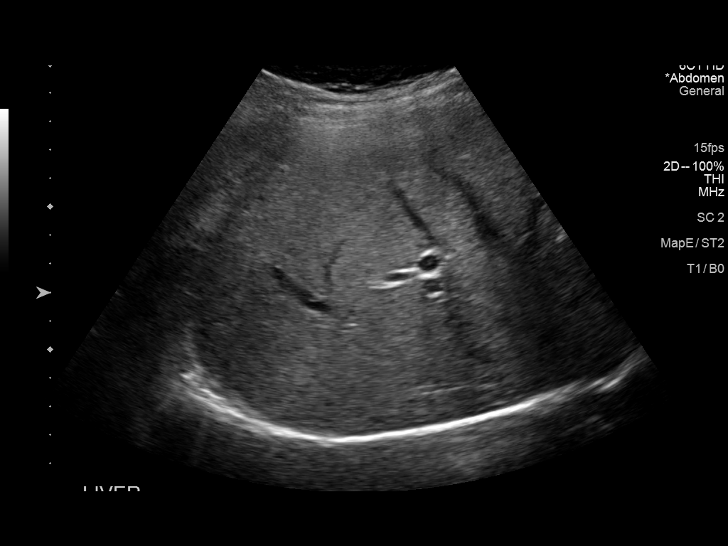
[im 40/48]
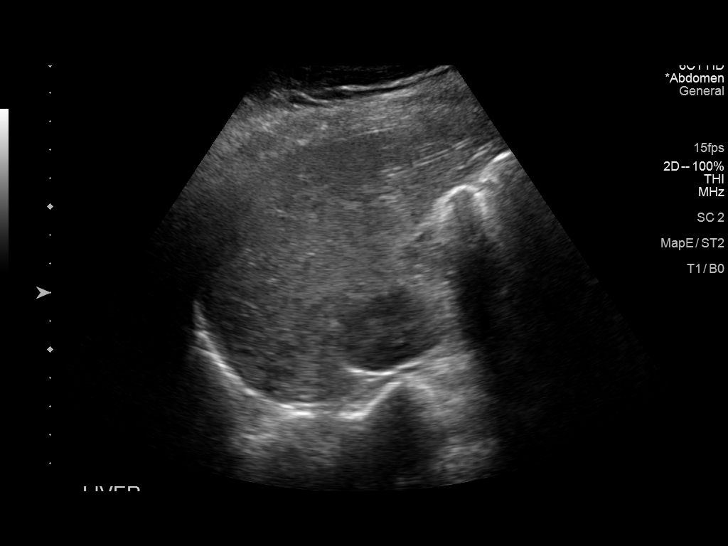
[im 44/48]
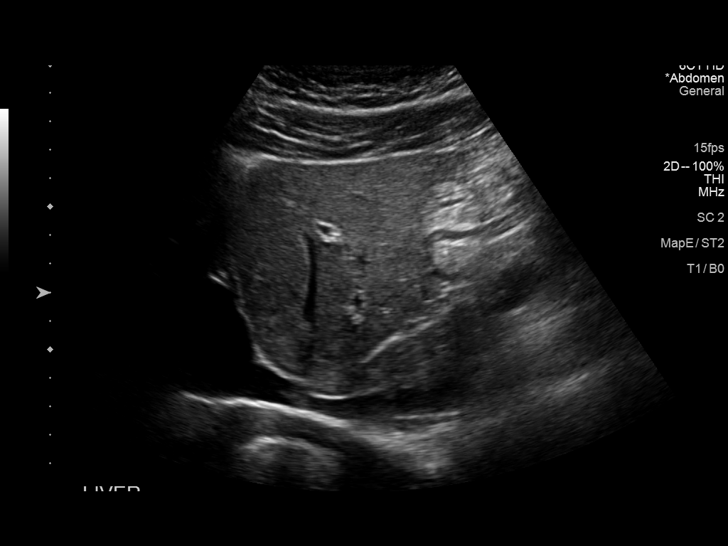
[im 48/48]
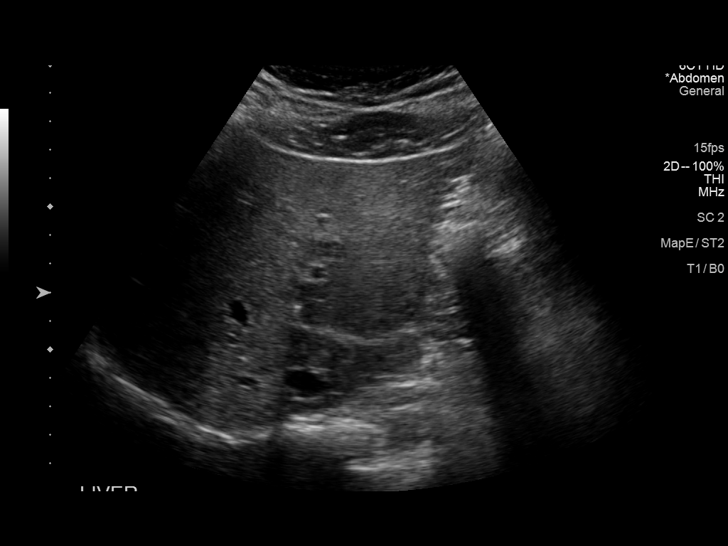

[14 of 25 positions shown; findings below may reference images not displayed]

FINDINGS: Gallbladder:

Non mobile stone within the gallbladder neck. No gallbladder wall
thickening or pericholecystic fluid. Negative sonographic Murphy's
sign.

Common bile duct:

Diameter: 3 mm

Liver:

No focal lesion identified. Within normal limits in parenchymal
echogenicity. Portal vein is patent on color Doppler imaging with
normal direction of blood flow towards the liver.

Other: None.
IMPRESSION: Non mobile stone within the gallbladder neck. No secondary signs to
suggest acute cholecystitis.
